# Patient Record
Sex: Male | Born: 1995 | Race: Black or African American | Hispanic: No | Marital: Single | Smoking: Never smoker
Health system: Southern US, Community
[De-identification: ages and names within clinical notes are randomized; demographics above are authoritative.]

## PROBLEM LIST (undated history)

## (undated) DIAGNOSIS — R011 Cardiac murmur, unspecified: Secondary | ICD-10-CM

---
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
## 2015-07-06 ENCOUNTER — Emergency Department: Payer: No Typology Code available for payment source

## 2015-07-06 ENCOUNTER — Emergency Department
Admission: EM | Admit: 2015-07-06 | Discharge: 2015-07-06 | Disposition: A | Discharge status: Home / Self Care | Payer: No Typology Code available for payment source | Attending: Emergency Medicine | Admitting: Emergency Medicine

## 2015-07-06 ENCOUNTER — Encounter: Payer: Self-pay | Admitting: Emergency Medicine

## 2015-07-06 DIAGNOSIS — Y998 Other external cause status: Secondary | ICD-10-CM | POA: Insufficient documentation

## 2015-07-06 DIAGNOSIS — Y9389 Activity, other specified: Secondary | ICD-10-CM | POA: Insufficient documentation

## 2015-07-06 DIAGNOSIS — Y92410 Unspecified street and highway as the place of occurrence of the external cause: Secondary | ICD-10-CM | POA: Insufficient documentation

## 2015-07-06 DIAGNOSIS — Z79899 Other long term (current) drug therapy: Secondary | ICD-10-CM | POA: Diagnosis not present

## 2015-07-06 DIAGNOSIS — S0093XA Contusion of unspecified part of head, initial encounter: Secondary | ICD-10-CM

## 2015-07-06 DIAGNOSIS — S0990XA Unspecified injury of head, initial encounter: Secondary | ICD-10-CM | POA: Diagnosis present

## 2015-07-06 HISTORY — DX: Cardiac murmur, unspecified: R01.1

## 2015-07-06 MED ORDER — IBUPROFEN 800 MG PO TABS: 800.0000 mg | ORAL_TABLET | Freq: Three times a day (TID) | ORAL | Status: AC | PRN

## 2015-07-06 MED ORDER — CYCLOBENZAPRINE HCL 5 MG PO TABS: 5.0000 mg | ORAL_TABLET | Freq: Three times a day (TID) | ORAL | Status: AC | PRN
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
## 2015-07-06 NOTE — ED Notes (Signed)
Pt discharged home after verbalizing understanding of discharge instructions; nad noted. 
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
## 2015-11-19 IMAGING — CR DG CHEST 1V PORT
1 series · 1 of 1 positions shown · non-contrast
Comparison: October 19, 2014

CLINICAL DATA: Aspiration pneumonitis

EXAM:
PORTABLE CHEST - 1 VIEW

[portable]
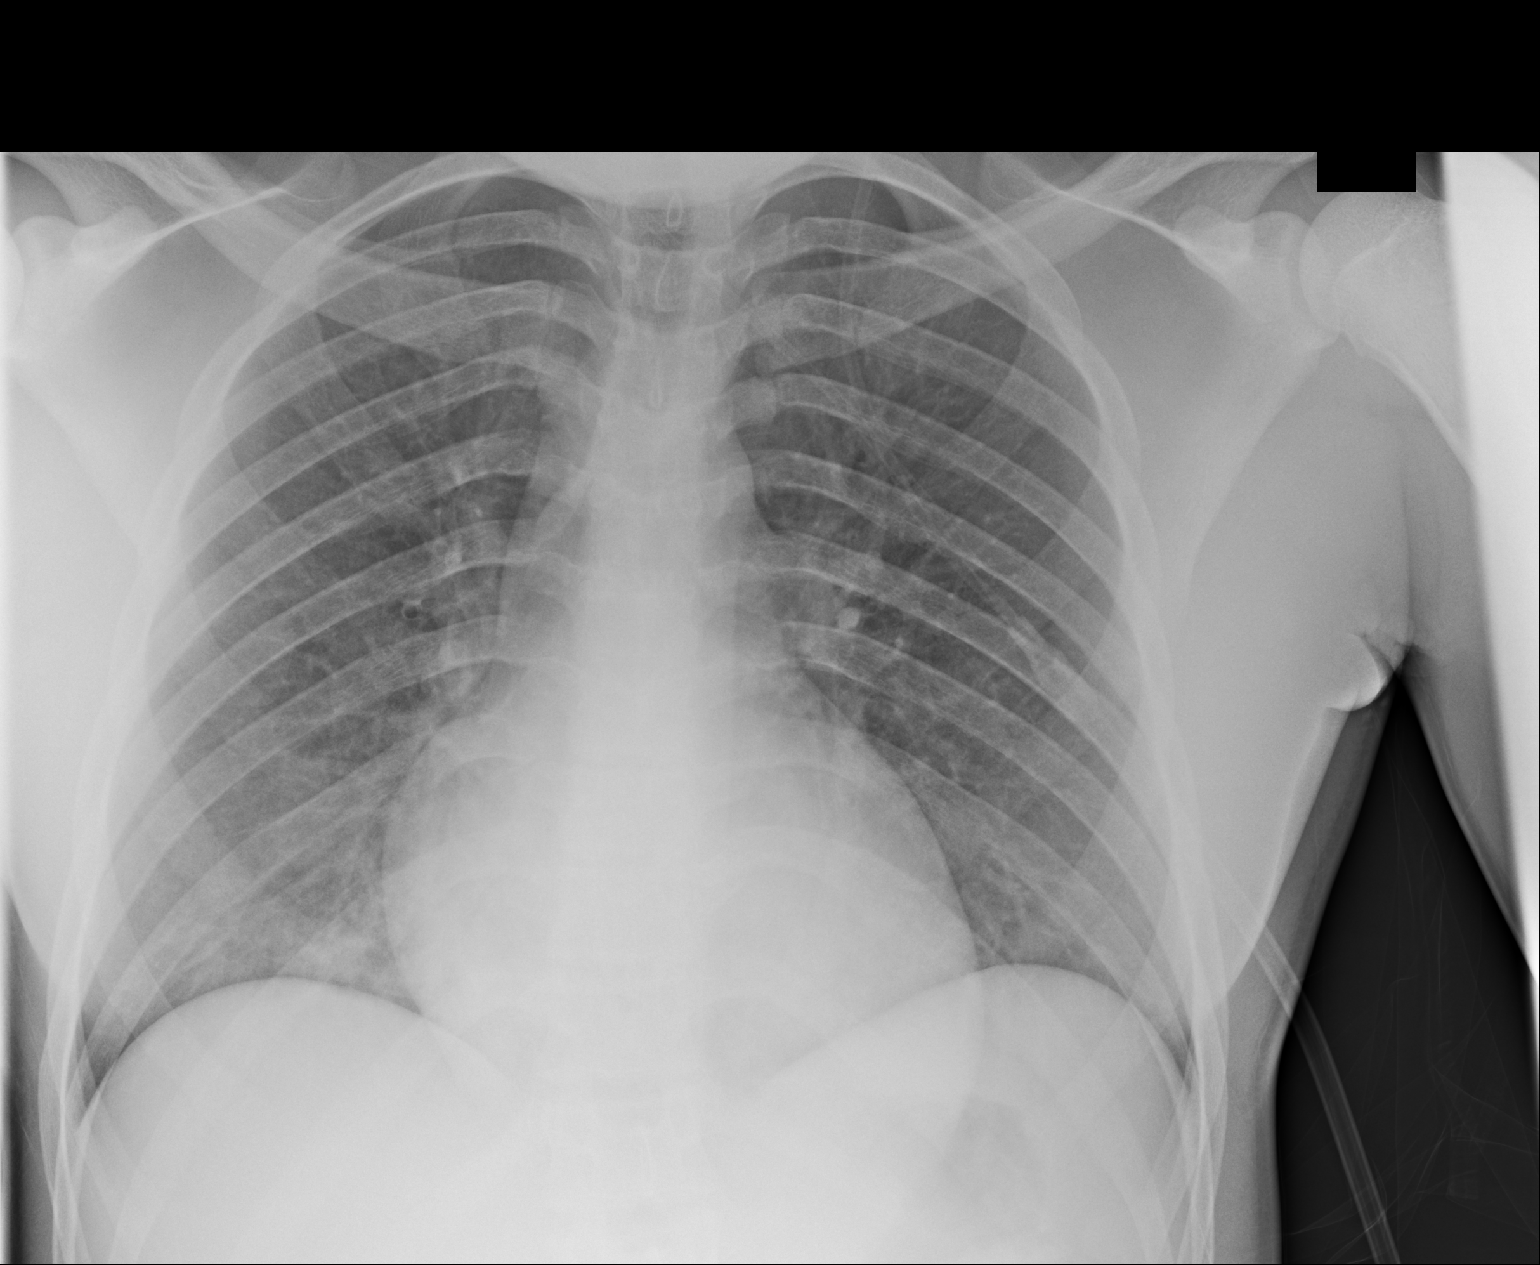

[1 of 1 positions shown; findings below may reference images not displayed]

FINDINGS: There is patchy infiltrate in both medial lung bases, stable. No new
opacity. Heart is upper normal in size with pulmonary vascularity
within normal limits. No adenopathy. No bone lesions.
IMPRESSION: Medial patchy bibasilar infiltrate, stable. No new opacity. No
change in cardiac silhouette.
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
